# Patient Record
Sex: Female | Born: 1965 | Race: White | Hispanic: No | Marital: Married | Smoking: Never smoker
Health system: Southern US, Community
[De-identification: ages and names within clinical notes are randomized; demographics above are authoritative.]

## PROBLEM LIST (undated history)

## (undated) DIAGNOSIS — E785 Hyperlipidemia, unspecified: Secondary | ICD-10-CM

## (undated) DIAGNOSIS — E119 Type 2 diabetes mellitus without complications: Secondary | ICD-10-CM

## (undated) DIAGNOSIS — I10 Essential (primary) hypertension: Secondary | ICD-10-CM

## (undated) DIAGNOSIS — K219 Gastro-esophageal reflux disease without esophagitis: Secondary | ICD-10-CM

## (undated) DIAGNOSIS — E559 Vitamin D deficiency, unspecified: Secondary | ICD-10-CM

## (undated) HISTORY — DX: Hyperlipidemia, unspecified: E78.5

## (undated) HISTORY — PX: DILATION AND CURETTAGE OF UTERUS: SHX78

## (undated) HISTORY — DX: Gastro-esophageal reflux disease without esophagitis: K21.9

## (undated) HISTORY — DX: Essential (primary) hypertension: I10

## (undated) HISTORY — PX: TONSILLECTOMY: SHX5217

## (undated) HISTORY — DX: Vitamin D deficiency, unspecified: E55.9

## (undated) HISTORY — DX: Type 2 diabetes mellitus without complications: E11.9

## (undated) HISTORY — PX: TUBAL LIGATION: SHX77

---

## 2004-07-26 ENCOUNTER — Ambulatory Visit: Payer: Self-pay

## 2004-08-02 ENCOUNTER — Ambulatory Visit: Payer: Self-pay

## 2004-09-01 ENCOUNTER — Ambulatory Visit: Payer: Self-pay

## 2004-09-08 ENCOUNTER — Observation Stay: Payer: Self-pay

## 2004-09-09 ENCOUNTER — Ambulatory Visit: Payer: Self-pay

## 2004-10-04 ENCOUNTER — Inpatient Hospital Stay: Payer: Self-pay

## 2005-07-11 ENCOUNTER — Ambulatory Visit: Payer: Self-pay

## 2006-04-13 ENCOUNTER — Ambulatory Visit: Payer: Self-pay | Admitting: Unknown Physician Specialty

## 2006-07-12 ENCOUNTER — Ambulatory Visit: Payer: Self-pay

## 2007-08-08 ENCOUNTER — Ambulatory Visit: Payer: Self-pay

## 2008-08-27 ENCOUNTER — Ambulatory Visit: Payer: Self-pay | Admitting: Family Medicine

## 2009-10-27 ENCOUNTER — Ambulatory Visit: Payer: Self-pay | Admitting: Family Medicine

## 2010-10-25 ENCOUNTER — Ambulatory Visit: Payer: Self-pay | Admitting: Obstetrics and Gynecology

## 2011-11-09 ENCOUNTER — Ambulatory Visit: Payer: Self-pay | Admitting: Family Medicine

## 2012-11-13 ENCOUNTER — Ambulatory Visit: Payer: Self-pay | Admitting: Family Medicine

## 2013-11-13 ENCOUNTER — Ambulatory Visit: Payer: Self-pay | Admitting: Family Medicine

## 2014-09-01 DIAGNOSIS — B351 Tinea unguium: Secondary | ICD-10-CM

## 2014-11-17 ENCOUNTER — Encounter: Payer: Self-pay | Admitting: Family Medicine

## 2014-11-17 ENCOUNTER — Ambulatory Visit: Admit: 2014-11-17 | Disposition: A | Discharge status: Home / Self Care | Payer: Self-pay | Admitting: Family Medicine

## 2014-12-29 ENCOUNTER — Encounter: Payer: Self-pay | Admitting: Internal Medicine

## 2015-02-16 ENCOUNTER — Telehealth: Payer: Self-pay | Admitting: Obstetrics and Gynecology

## 2015-02-16 NOTE — Telephone Encounter (Signed)
PT WAS REFERRED TO ENDOCRINOLOGIST AT  [place] BUT SHE WOULD LIKE TO STAY IN [place], PT IS AWARE THAT [facility] IS BOOKED OUT A FEW MONTHS BUT SHE WOULD LIKE TO GO AHEAD AND SEE THEM.

## 2015-03-30 NOTE — Telephone Encounter (Signed)
done

## 2015-08-10 ENCOUNTER — Encounter: Payer: Self-pay | Admitting: Obstetrics and Gynecology

## 2015-12-01 ENCOUNTER — Other Ambulatory Visit: Payer: Self-pay | Admitting: Family Medicine

## 2015-12-01 DIAGNOSIS — Z1231 Encounter for screening mammogram for malignant neoplasm of breast: Secondary | ICD-10-CM

## 2016-04-12 ENCOUNTER — Ambulatory Visit
Admission: RE | Admit: 2016-04-12 | Discharge: 2016-04-12 | Disposition: A | Discharge status: Home / Self Care | Payer: BLUE CROSS/BLUE SHIELD | Source: Ambulatory Visit | Attending: Family Medicine | Admitting: Family Medicine

## 2016-04-12 DIAGNOSIS — Z1231 Encounter for screening mammogram for malignant neoplasm of breast: Secondary | ICD-10-CM | POA: Diagnosis not present

## 2017-04-04 ENCOUNTER — Other Ambulatory Visit: Payer: Self-pay | Admitting: Family Medicine

## 2017-05-25 ENCOUNTER — Other Ambulatory Visit: Payer: Self-pay | Admitting: Family Medicine

## 2017-05-25 DIAGNOSIS — Z1231 Encounter for screening mammogram for malignant neoplasm of breast: Secondary | ICD-10-CM

## 2017-06-11 ENCOUNTER — Ambulatory Visit
Admission: RE | Admit: 2017-06-11 | Discharge: 2017-06-11 | Disposition: A | Discharge status: Home / Self Care | Payer: 59 | Source: Ambulatory Visit | Attending: Family Medicine | Admitting: Family Medicine

## 2017-06-11 DIAGNOSIS — Z1231 Encounter for screening mammogram for malignant neoplasm of breast: Secondary | ICD-10-CM | POA: Diagnosis not present

## 2018-07-19 ENCOUNTER — Other Ambulatory Visit: Payer: Self-pay | Admitting: Family Medicine

## 2018-07-19 DIAGNOSIS — Z1231 Encounter for screening mammogram for malignant neoplasm of breast: Secondary | ICD-10-CM

## 2018-08-14 ENCOUNTER — Ambulatory Visit
Admission: RE | Admit: 2018-08-14 | Discharge: 2018-08-14 | Disposition: A | Discharge status: Home / Self Care | Payer: 59 | Source: Ambulatory Visit | Attending: Family Medicine | Admitting: Family Medicine

## 2018-08-14 DIAGNOSIS — Z1231 Encounter for screening mammogram for malignant neoplasm of breast: Secondary | ICD-10-CM

## 2018-09-12 ENCOUNTER — Ambulatory Visit (INDEPENDENT_AMBULATORY_CARE_PROVIDER_SITE_OTHER): Payer: 59 | Admitting: Obstetrics and Gynecology

## 2018-09-12 ENCOUNTER — Other Ambulatory Visit (HOSPITAL_COMMUNITY)
Admission: RE | Admit: 2018-09-12 | Discharge: 2018-09-12 | Disposition: A | Discharge status: Home / Self Care | Payer: 59 | Source: Ambulatory Visit | Attending: Obstetrics and Gynecology | Admitting: Obstetrics and Gynecology

## 2018-09-12 ENCOUNTER — Encounter: Payer: Self-pay | Admitting: Obstetrics and Gynecology

## 2018-09-12 VITALS — BP 122/87 | HR 80 | Ht 63.0 in | Wt 168.3 lb

## 2018-09-12 DIAGNOSIS — Z01419 Encounter for gynecological examination (general) (routine) without abnormal findings: Secondary | ICD-10-CM | POA: Diagnosis not present

## 2018-09-12 NOTE — Progress Notes (Signed)
Subjective:   Victoria NgoDianne R Baptista is a 53 y.o. 62P0000 Caucasian female here for a routine well-woman exam.  No LMP recorded. Patient is perimenopausal.   Having menses every three months. Occasional hot flashes. Current complaints: none PCP: [facility]       Doesn't need labs, PCP does them.   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  Past Medical History Past Medical History:  Diagnosis Date  . Hyperlipemia   . Hypertension   . Type 2 diabetes mellitus [facility](HCC)     Past Surgical History Past Surgical History:  Procedure Laterality Date  . TONSILLECTOMY    . TUBAL LIGATION      Gynecologic History G2P0000  No LMP recorded. Patient is perimenopausal. Contraception: tubal ligation Last Pap: ?Marland Kitchen. Results were: normal Last mammogram: 07/2018. Results were: normal   Obstetric History OB History  Gravida Para Term Preterm AB Living  2 2     0    SAB TAB Ectopic Multiple Live Births  0            # Outcome Date GA Lbr Len/2nd Weight Sex Delivery Anes PTL Lv  2 Para 2006    F CS-Unspec     1 Para 1999    F Vag-Spont       Current Medications Current Outpatient Medications on File Prior to Visit  Medication Sig Dispense Refill  . clobetasol cream (TEMOVATE) 0.05 % Apply 1 application topically 2 (two) times daily.    . empagliflozin (JARDIANCE) 10 MG TABS tablet Take 10 mg by mouth daily.    Marland Kitchen. escitalopram (LEXAPRO) 20 MG tablet Take 20 mg by mouth daily.    . metFORMIN (GLUCOPHAGE-XR) 750 MG 24 hr tablet Take 750 mg by mouth daily with breakfast.    . omeprazole (PRILOSEC) 20 MG capsule  Take 20 mg by mouth daily.    . simvastatin (ZOCOR) 40 MG tablet Take 40 mg by mouth daily.     No current facility-administered medications on file prior to visit.     Review of Systems Patient denies any headaches, blurred vision, shortness of breath, chest pain, abdominal pain, problems with bowel movements, urination, or intercourse.  Objective:  BP 122/87   Pulse 80   Ht 5\' 3"  (1.6 m)   Wt 168 lb 4.8 oz (76.3 kg)   BMI 29.81 kg/m  Physical Exam  General:  Well developed, well nourished, no acute distress. She is alert and oriented x3. Skin:  Warm and dry Neck:  Midline trachea, no thyromegaly or nodules Cardiovascular: Regular rate and rhythm, no murmur heard Lungs:  Effort normal, all lung fields clear to auscultation bilaterally Breasts:  No dominant palpable mass, retraction, or nipple discharge Abdomen:  Soft, non tender, no hepatosplenomegaly or masses Pelvic:  External genitalia is normal in appearance.  The vagina is normal in appearance. The cervix is bulbous, no CMT.  Thin prep pap is done with HR HPV cotesting. Uterus is felt to be normal size, shape, and contour.  No adnexal masses or tenderness noted. Extremities:  No swelling or varicosities noted Psych:  She has a normal mood and affect  Assessment:   Healthy well-woman exam  Plan:  Encouraged continued healthy lifestyle and increasing activity. F/U 1 year for AE, or sooner if needed Mammogram up to date Colonoscopy done 2017  Lizzet Hendley Suzan NailerN Jibreel Fedewa, CNM

## 2018-09-17 LAB — CYTOLOGY - PAP
Diagnosis: NEGATIVE
HPV: NOT DETECTED

## 2020-08-05 ENCOUNTER — Other Ambulatory Visit: Payer: Self-pay | Admitting: Family Medicine

## 2020-08-05 DIAGNOSIS — Z1231 Encounter for screening mammogram for malignant neoplasm of breast: Secondary | ICD-10-CM

## 2020-08-27 ENCOUNTER — Ambulatory Visit
Admission: RE | Admit: 2020-08-27 | Discharge: 2020-08-27 | Disposition: A | Discharge status: Home / Self Care | Payer: BC Managed Care – PPO | Source: Ambulatory Visit | Attending: Family Medicine | Admitting: Family Medicine

## 2020-08-27 ENCOUNTER — Other Ambulatory Visit: Payer: Self-pay

## 2020-08-27 DIAGNOSIS — Z1231 Encounter for screening mammogram for malignant neoplasm of breast: Secondary | ICD-10-CM | POA: Insufficient documentation

## 2021-12-04 IMAGING — MG MM DIGITAL SCREENING BILAT W/ TOMO AND CAD
6 of 10 series · 6 of 30 positions shown · non-contrast
Comparison: Previous exam(s).

CLINICAL DATA: Screening.

EXAM:
DIGITAL SCREENING BILATERAL MAMMOGRAM WITH TOMO AND CAD

[R CC synth-2D]
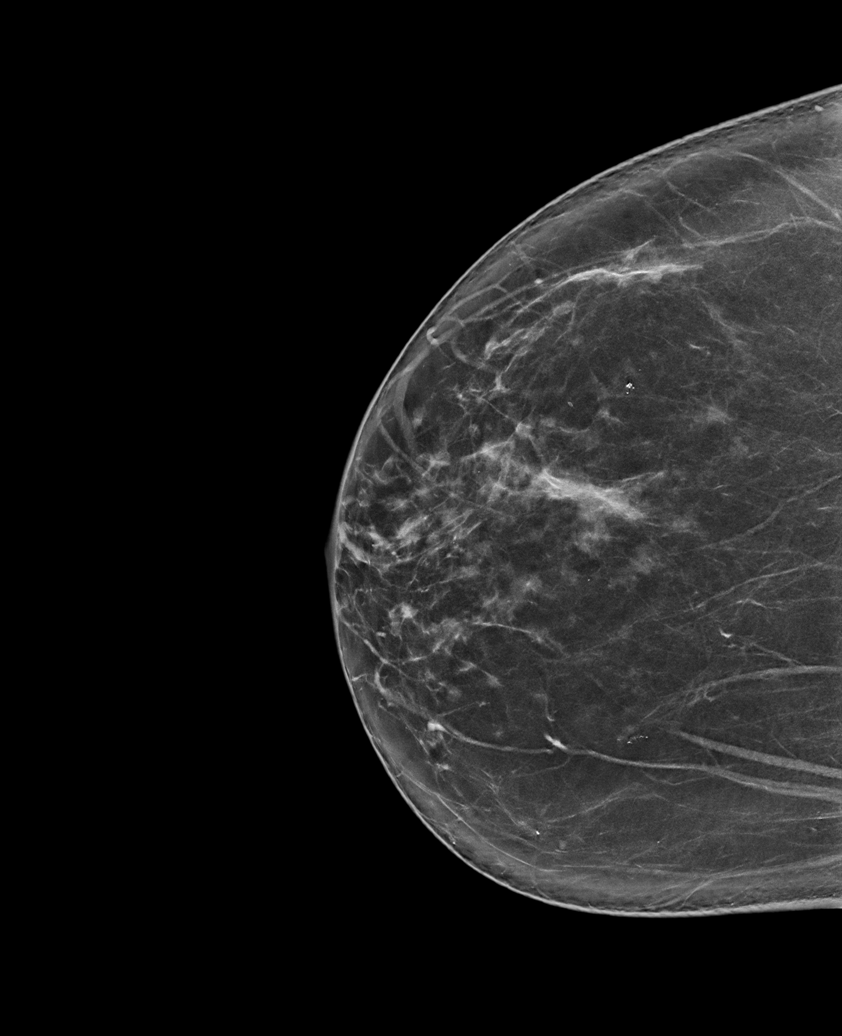

[L CC synth-2D]
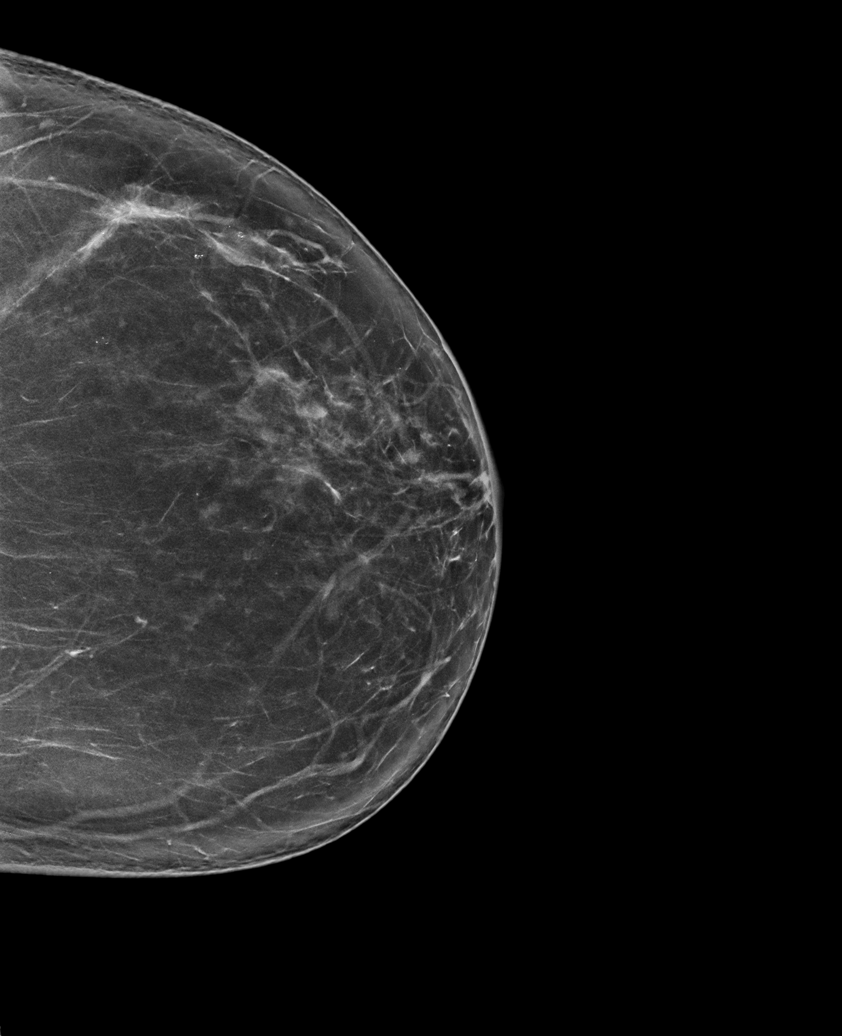

[L MLO synth-2D (1 of 2)]
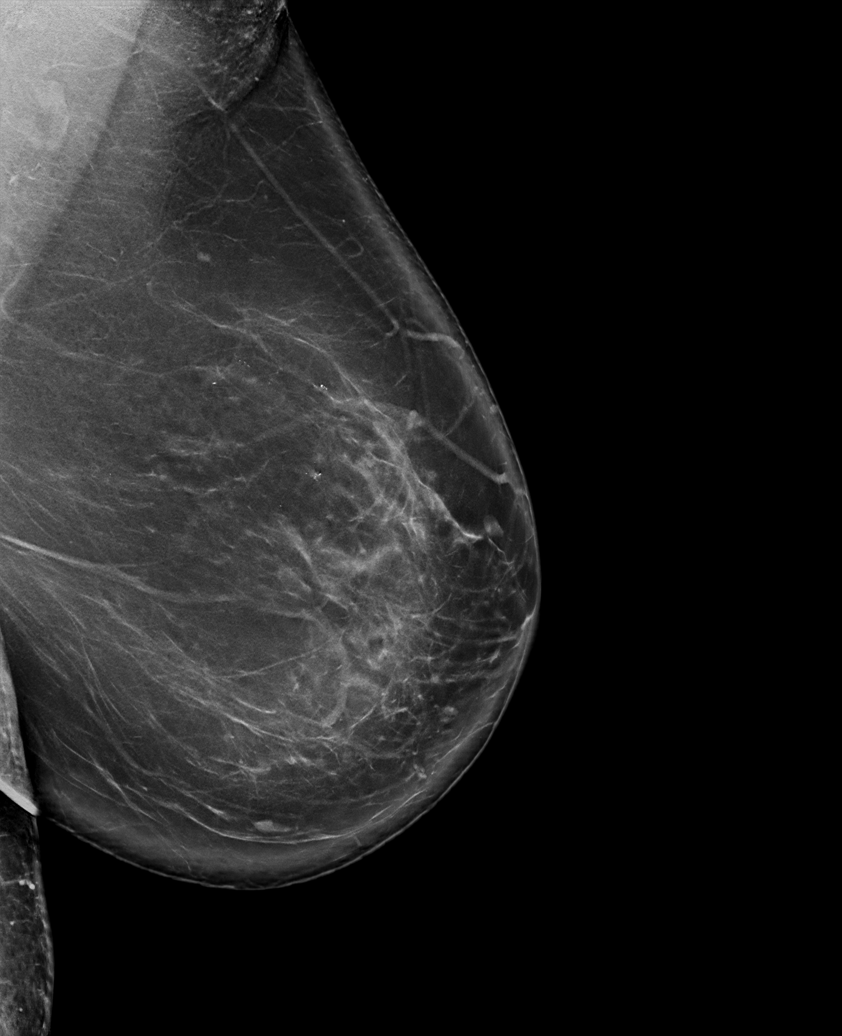

[L MLO synth-2D (2 of 2)]
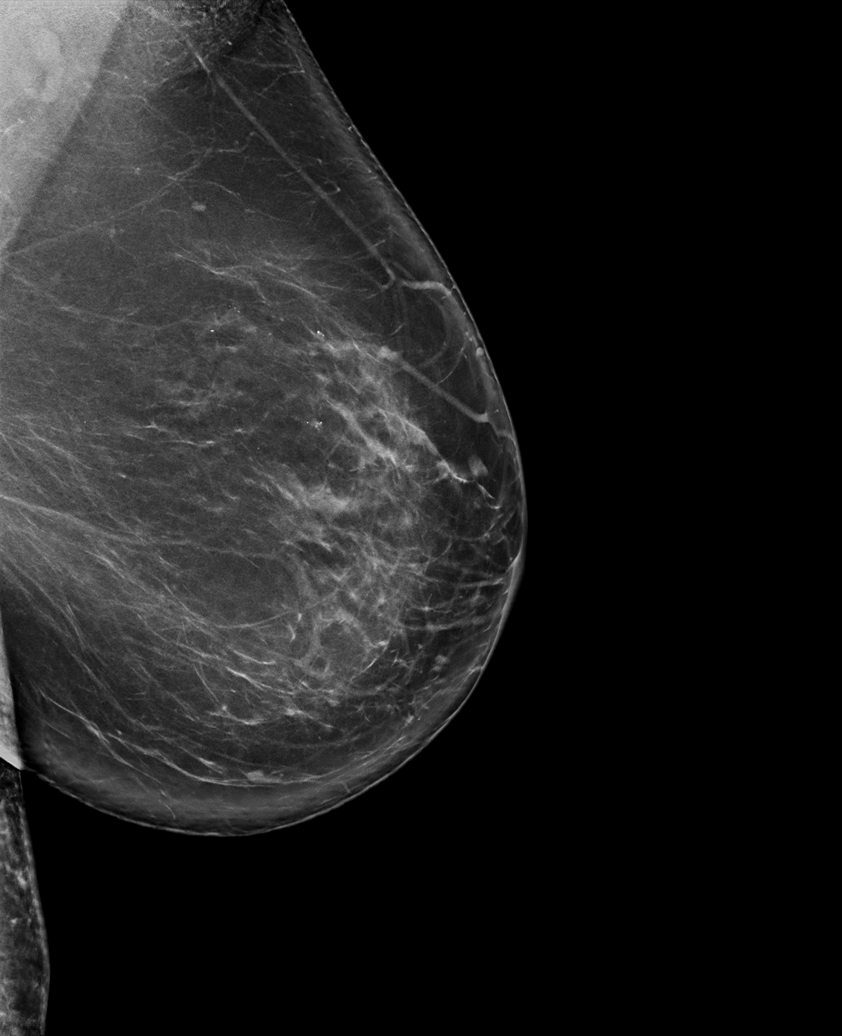

[R MLO synth-2D]
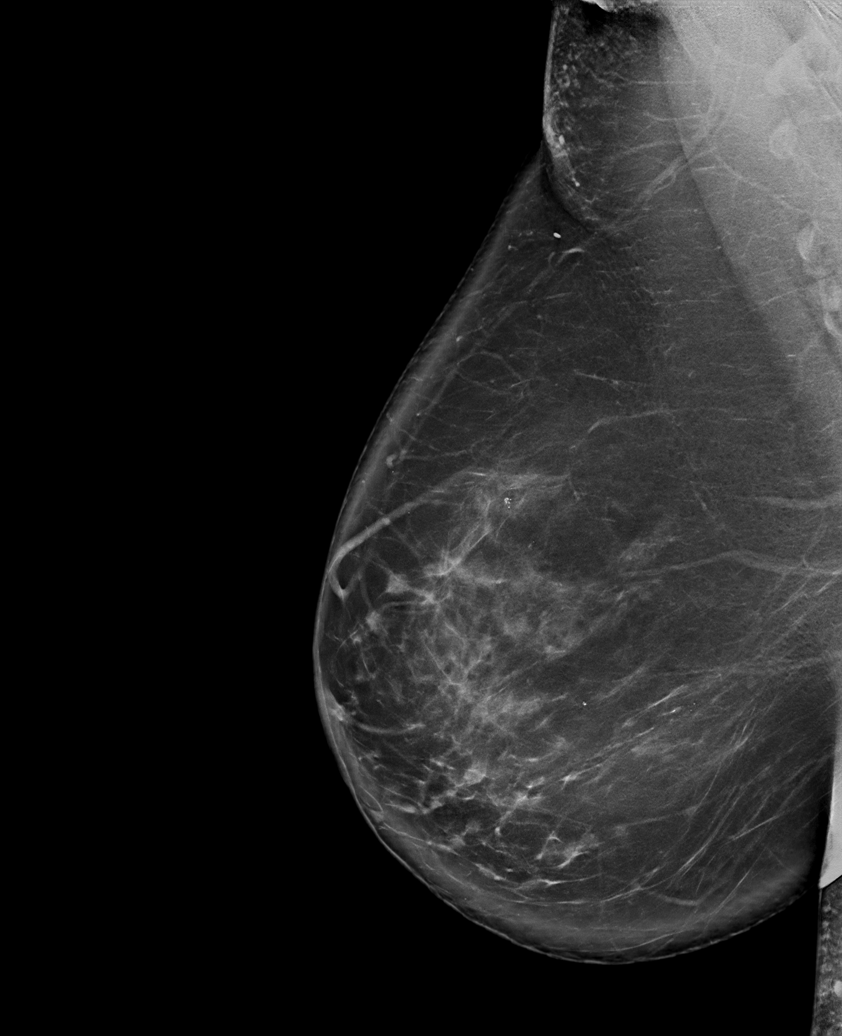

[R CC tomo · tomo slice 42/83.0]
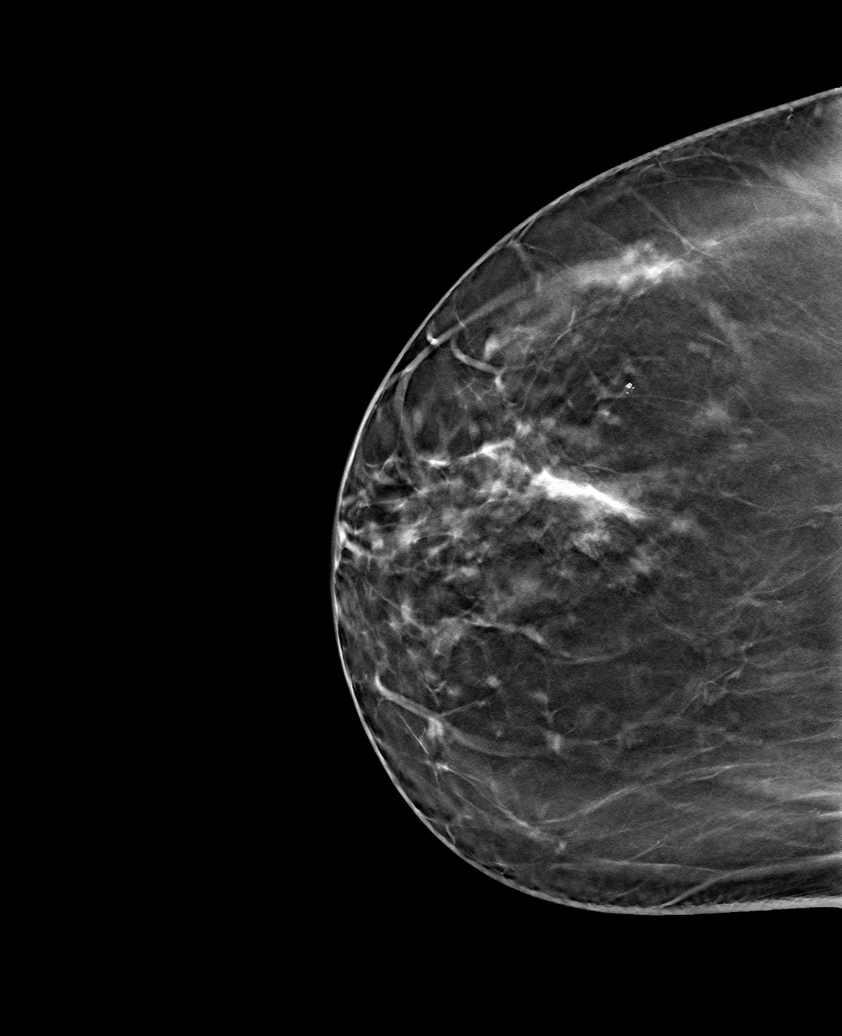

[6 of 30 positions shown; findings below may reference images not displayed]

ACR Breast Density Category b: There are scattered areas of
fibroglandular density.
FINDINGS: There are no findings suspicious for malignancy. The images were
evaluated with computer-aided detection.
IMPRESSION: No mammographic evidence of malignancy. A result letter of this
screening mammogram will be mailed directly to the patient.

RECOMMENDATION:
Screening mammogram in one year. (Code:ZP-7-VX7)

BI-RADS CATEGORY  1: Negative.

## 2022-10-12 ENCOUNTER — Other Ambulatory Visit: Payer: Self-pay | Admitting: Family Medicine

## 2022-10-12 DIAGNOSIS — Z1231 Encounter for screening mammogram for malignant neoplasm of breast: Secondary | ICD-10-CM

## 2022-11-08 ENCOUNTER — Encounter: Payer: Self-pay | Admitting: Radiology

## 2022-11-08 ENCOUNTER — Ambulatory Visit
Admission: RE | Admit: 2022-11-08 | Discharge: 2022-11-08 | Disposition: A | Discharge status: Home / Self Care | Payer: BC Managed Care – PPO | Source: Ambulatory Visit | Attending: Family Medicine | Admitting: Family Medicine

## 2022-11-08 DIAGNOSIS — Z1231 Encounter for screening mammogram for malignant neoplasm of breast: Secondary | ICD-10-CM | POA: Insufficient documentation

## 2022-11-13 ENCOUNTER — Other Ambulatory Visit: Payer: Self-pay | Admitting: Family Medicine

## 2022-11-13 DIAGNOSIS — N63 Unspecified lump in unspecified breast: Secondary | ICD-10-CM

## 2022-11-13 DIAGNOSIS — R928 Other abnormal and inconclusive findings on diagnostic imaging of breast: Secondary | ICD-10-CM

## 2022-11-22 ENCOUNTER — Ambulatory Visit
Admission: RE | Admit: 2022-11-22 | Discharge: 2022-11-22 | Disposition: A | Discharge status: Home / Self Care | Payer: BC Managed Care – PPO | Source: Ambulatory Visit | Attending: Family Medicine | Admitting: Family Medicine

## 2022-11-22 DIAGNOSIS — R928 Other abnormal and inconclusive findings on diagnostic imaging of breast: Secondary | ICD-10-CM

## 2022-11-22 DIAGNOSIS — N63 Unspecified lump in unspecified breast: Secondary | ICD-10-CM | POA: Insufficient documentation

## 2022-11-24 ENCOUNTER — Other Ambulatory Visit: Payer: Self-pay | Admitting: Family Medicine

## 2022-11-24 DIAGNOSIS — R928 Other abnormal and inconclusive findings on diagnostic imaging of breast: Secondary | ICD-10-CM

## 2022-11-24 DIAGNOSIS — N63 Unspecified lump in unspecified breast: Secondary | ICD-10-CM

## 2022-11-30 ENCOUNTER — Ambulatory Visit
Admission: RE | Admit: 2022-11-30 | Discharge: 2022-11-30 | Disposition: A | Discharge status: Home / Self Care | Payer: BC Managed Care – PPO | Source: Ambulatory Visit | Attending: Family Medicine | Admitting: Family Medicine

## 2022-11-30 DIAGNOSIS — N63 Unspecified lump in unspecified breast: Secondary | ICD-10-CM | POA: Insufficient documentation

## 2022-11-30 DIAGNOSIS — R928 Other abnormal and inconclusive findings on diagnostic imaging of breast: Secondary | ICD-10-CM | POA: Insufficient documentation

## 2022-11-30 DIAGNOSIS — N6323 Unspecified lump in the left breast, lower outer quadrant: Secondary | ICD-10-CM | POA: Insufficient documentation

## 2022-11-30 DIAGNOSIS — N6002 Solitary cyst of left breast: Secondary | ICD-10-CM | POA: Insufficient documentation

## 2022-11-30 MED ORDER — LIDOCAINE-EPINEPHRINE (PF) 1 %-1:200000 IJ SOLN
10.0000 mL | Freq: Once | INTRAMUSCULAR | Status: AC
  Administered 2022-11-30: 10 mL via INTRADERMAL
  Filled 2022-11-30: qty 10

## 2022-11-30 MED ORDER — LIDOCAINE HCL (PF) 1 % IJ SOLN
5.0000 mL | Freq: Once | INTRAMUSCULAR | Status: AC
  Administered 2022-11-30: 5 mL via INTRADERMAL
  Filled 2022-11-30: qty 5

## 2024-04-02 ENCOUNTER — Other Ambulatory Visit: Payer: Self-pay | Admitting: Family Medicine

## 2024-04-02 DIAGNOSIS — Z1231 Encounter for screening mammogram for malignant neoplasm of breast: Secondary | ICD-10-CM

## 2024-04-16 ENCOUNTER — Ambulatory Visit
Admission: RE | Admit: 2024-04-16 | Discharge: 2024-04-16 | Disposition: A | Discharge status: Home / Self Care | Source: Ambulatory Visit | Attending: Family Medicine | Admitting: Family Medicine

## 2024-04-16 DIAGNOSIS — Z1231 Encounter for screening mammogram for malignant neoplasm of breast: Secondary | ICD-10-CM | POA: Insufficient documentation
